# Patient Record
Sex: Female | Born: 1990 | Race: White | Hispanic: No | Marital: Single | Smoking: Former smoker
Health system: Southern US, Community
[De-identification: ages and names within clinical notes are randomized; demographics above are authoritative.]

## PROBLEM LIST (undated history)

## (undated) DIAGNOSIS — Z8781 Personal history of (healed) traumatic fracture: Secondary | ICD-10-CM

## (undated) DIAGNOSIS — R600 Localized edema: Secondary | ICD-10-CM

## (undated) DIAGNOSIS — Z8719 Personal history of other diseases of the digestive system: Secondary | ICD-10-CM

## (undated) DIAGNOSIS — G43909 Migraine, unspecified, not intractable, without status migrainosus: Secondary | ICD-10-CM

## (undated) DIAGNOSIS — M1990 Unspecified osteoarthritis, unspecified site: Secondary | ICD-10-CM

## (undated) DIAGNOSIS — M419 Scoliosis, unspecified: Secondary | ICD-10-CM

## (undated) DIAGNOSIS — F319 Bipolar disorder, unspecified: Secondary | ICD-10-CM

## (undated) HISTORY — PX: TUBAL LIGATION: SHX77

## (undated) HISTORY — DX: Localized edema: R60.0

## (undated) HISTORY — PX: TONSILLECTOMY: SUR1361

---

## 2011-08-23 ENCOUNTER — Emergency Department (HOSPITAL_COMMUNITY)
Admission: EM | Admit: 2011-08-23 | Discharge: 2011-08-24 | Discharge status: Against Medical Advice | Payer: Medicaid Other | Attending: Emergency Medicine | Admitting: Emergency Medicine

## 2011-08-23 ENCOUNTER — Encounter: Payer: Self-pay | Admitting: Emergency Medicine

## 2011-08-23 DIAGNOSIS — M25462 Effusion, left knee: Secondary | ICD-10-CM

## 2011-08-23 DIAGNOSIS — M79609 Pain in unspecified limb: Secondary | ICD-10-CM | POA: Insufficient documentation

## 2011-08-23 DIAGNOSIS — M25469 Effusion, unspecified knee: Secondary | ICD-10-CM | POA: Insufficient documentation

## 2011-08-23 NOTE — ED Notes (Signed)
Pt sts that she thinks she has a blood clot. Pt was in shower when she went to sit on her knees she had pain behind her R knee. Pt cont. To have pain with ambulation. Pt has never had pain like this in the past. CMS intact.

## 2011-08-24 MED ORDER — ENOXAPARIN SODIUM 60 MG/0.6ML ~~LOC~~ SOLN
1.0000 mg/kg | Freq: Once | SUBCUTANEOUS | Status: DC
  Filled 2011-08-24: qty 0.6

## 2011-08-24 NOTE — ED Provider Notes (Signed)
Medical screening examination/treatment/procedure(s) were performed by non-physician practitioner and as supervising physician I was immediately available for consultation/collaboration.   Vida Roller, MD 08/24/11 [phone]

## 2011-08-24 NOTE — ED Notes (Signed)
Venous Doppler ordered.  Went into room to give Lovenox injection.  Pt not in room.  Earley Favor NP made aware

## 2011-08-24 NOTE — ED Provider Notes (Signed)
History     CSN: [id number] Arrival date & time: 08/23/2011  9:58 PM   First MD Initiated Contact with Patient 08/24/11 0006      Chief Complaint  Patient presents with  . Leg Pain    (Consider location/radiation/quality/duration/timing/severity/associated sxs/prior treatment) HPI Comments: Patien tnoted a pain and swelling of lateral L knee today after kneeing now with less swelling but small bruise in the same area   Patient is a 20 y.o. female presenting with leg pain. The history is provided by the patient.  Leg Pain  The incident occurred 6 to 12 hours ago. The incident occurred at home. There was no injury mechanism. The pain is present in the left knee. The quality of the pain is described as aching. The pain is at a severity of 3/10. The pain is mild. The pain has been constant since onset. Pertinent negatives include no numbness, no inability to bear weight, no loss of motion and no muscle weakness. She reports no foreign bodies present. The symptoms are aggravated by bearing weight. She has tried nothing for the symptoms.    History reviewed. No pertinent past medical history.  History reviewed. No pertinent past surgical history.  History reviewed. No pertinent family history.  History  Substance Use Topics  . Smoking status: Smoker, Current Status Unknown    Types: Cigarettes  . Smokeless tobacco: Not on file  . Alcohol Use: No    OB History    Grav Para Term Preterm Abortions TAB SAB Ect Mult Living                  Review of Systems  Constitutional: Negative.   HENT: Negative.   Eyes: Negative.   Respiratory: Negative.   Cardiovascular: Negative.   Gastrointestinal: Negative.   Genitourinary: Negative.   Musculoskeletal: Negative for joint swelling.  Neurological: Negative.  Negative for numbness.  Hematological: Negative.   Psychiatric/Behavioral: Negative.     Allergies  Review of patient's allergies indicates no known allergies.  Home  Medications   Current Outpatient Rx  Name Route Sig Dispense Refill  . OMEPRAZOLE 20 MG PO CPDR Oral Take 20 mg by mouth 2 (two) times daily.      . TOPIRAMATE 100 MG PO TABS Oral Take 100 mg by mouth daily.        BP 123/70  Pulse 83  Temp(Src) 98 F (36.7 C) (Oral)  Resp 20  Ht 5\' 1"  (1.549 m)  Wt 118 lb (53.524 kg)  BMI 22.30 kg/m2  SpO2 100%  Physical Exam  Constitutional: She is oriented to person, place, and time. She appears well-developed and well-nourished.  Neck: Neck supple.  Cardiovascular: Regular rhythm.   Abdominal: Bowel sounds are normal.  Musculoskeletal: She exhibits tenderness. She exhibits no edema.       Right shoulder: She exhibits tenderness.  Neurological: She is oriented to person, place, and time.  Skin: Skin is warm and dry.  Psychiatric: She has a normal mood and affect.    ED Course  Procedures (including critical care time)  Labs Reviewed - No data to display No results found.   1. Swelling of knee joint, left       MDM  DVT ve bakers cyst will bring patien tback in AM for vascular study         Arman Filter, NP 08/24/11 4098  Arman Filter, NP 08/24/11 0037  Arman Filter, NP 08/24/11 [phone]

## 2011-10-05 DIAGNOSIS — F988 Other specified behavioral and emotional disorders with onset usually occurring in childhood and adolescence: Secondary | ICD-10-CM

## 2011-10-05 HISTORY — DX: Other specified behavioral and emotional disorders with onset usually occurring in childhood and adolescence: F98.8

## 2015-08-08 ENCOUNTER — Encounter (HOSPITAL_COMMUNITY): Payer: Self-pay | Admitting: *Deleted

## 2015-08-08 ENCOUNTER — Emergency Department (HOSPITAL_COMMUNITY): Payer: Medicaid Other

## 2015-08-08 ENCOUNTER — Emergency Department (HOSPITAL_COMMUNITY)
Admission: EM | Admit: 2015-08-08 | Discharge: 2015-08-09 | Disposition: A | Discharge status: Home / Self Care | Payer: Medicaid Other | Attending: Physician Assistant | Admitting: Physician Assistant

## 2015-08-08 DIAGNOSIS — S0033XA Contusion of nose, initial encounter: Secondary | ICD-10-CM | POA: Insufficient documentation

## 2015-08-08 DIAGNOSIS — Y9289 Other specified places as the place of occurrence of the external cause: Secondary | ICD-10-CM | POA: Diagnosis not present

## 2015-08-08 DIAGNOSIS — Y998 Other external cause status: Secondary | ICD-10-CM | POA: Insufficient documentation

## 2015-08-08 DIAGNOSIS — Y9389 Activity, other specified: Secondary | ICD-10-CM | POA: Insufficient documentation

## 2015-08-08 DIAGNOSIS — S32030A Wedge compression fracture of third lumbar vertebra, initial encounter for closed fracture: Secondary | ICD-10-CM

## 2015-08-08 DIAGNOSIS — Z720 Tobacco use: Secondary | ICD-10-CM | POA: Diagnosis not present

## 2015-08-08 DIAGNOSIS — S3992XA Unspecified injury of lower back, initial encounter: Secondary | ICD-10-CM | POA: Diagnosis present

## 2015-08-08 DIAGNOSIS — S32039A Unspecified fracture of third lumbar vertebra, initial encounter for closed fracture: Secondary | ICD-10-CM

## 2015-08-08 HISTORY — DX: Unspecified osteoarthritis, unspecified site: M19.90

## 2015-08-08 LAB — CBC WITH DIFFERENTIAL/PLATELET
BASOS PCT: 0 %
Basophils Absolute: 0 10*3/uL (ref 0.0–0.1)
EOS ABS: 0 10*3/uL (ref 0.0–0.7)
Eosinophils Relative: 0 %
HEMATOCRIT: 41.8 % (ref 36.0–46.0)
HEMOGLOBIN: 14 g/dL (ref 12.0–15.0)
LYMPHS ABS: 1.1 10*3/uL (ref 0.7–4.0)
LYMPHS PCT: 11 %
MCH: 32.7 pg (ref 26.0–34.0)
MCHC: 33.5 g/dL (ref 30.0–36.0)
MCV: 97.7 fL (ref 78.0–100.0)
MONOS PCT: 5 %
Monocytes Absolute: 0.4 10*3/uL (ref 0.1–1.0)
Neutro Abs: 8.2 10*3/uL — ABNORMAL HIGH (ref 1.7–7.7)
Neutrophils Relative %: 84 %
Platelets: 190 10*3/uL (ref 150–400)
RBC: 4.28 MIL/uL (ref 3.87–5.11)
RDW: 12.9 % (ref 11.5–15.5)
WBC: 9.7 10*3/uL (ref 4.0–10.5)

## 2015-08-08 LAB — PREGNANCY, URINE: PREG TEST UR: NEGATIVE

## 2015-08-08 LAB — COMPREHENSIVE METABOLIC PANEL
ALBUMIN: 3.9 g/dL (ref 3.5–5.0)
ALK PHOS: 95 U/L (ref 38–126)
ALT: 19 U/L (ref 14–54)
AST: 28 U/L (ref 15–41)
Anion gap: 13 (ref 5–15)
BUN: 6 mg/dL (ref 6–20)
CALCIUM: 9.4 mg/dL (ref 8.9–10.3)
CO2: 23 mmol/L (ref 22–32)
CREATININE: 0.81 mg/dL (ref 0.44–1.00)
Chloride: 108 mmol/L (ref 101–111)
GFR calc Af Amer: >60 mL/min (ref >60)
GFR calc non Af Amer: >60 mL/min (ref >60)
GLUCOSE: 110 mg/dL — AB (ref 65–99)
Potassium: 3.2 mmol/L — ABNORMAL LOW (ref 3.5–5.1)
SODIUM: 144 mmol/L (ref 135–145)
Total Bilirubin: 0.6 mg/dL (ref 0.3–1.2)
Total Protein: 6.3 g/dL — ABNORMAL LOW (ref 6.5–8.1)

## 2015-08-08 LAB — URINALYSIS W MICROSCOPIC (NOT AT ARMC)
Bilirubin Urine: NEGATIVE
Glucose, UA: NEGATIVE mg/dL
Hgb urine dipstick: NEGATIVE
KETONES UR: NEGATIVE mg/dL
Leukocytes, UA: NEGATIVE
NITRITE: NEGATIVE
PH: 7 (ref 5.0–8.0)
Protein, ur: NEGATIVE mg/dL
SPECIFIC GRAVITY, URINE: 1.012 (ref 1.005–1.030)
Urobilinogen, UA: 0.2 mg/dL (ref 0.0–1.0)

## 2015-08-08 MED ORDER — ONDANSETRON HCL 4 MG/2ML IJ SOLN: 4.0000 mg | Freq: Once | INTRAMUSCULAR | Status: DC

## 2015-08-08 MED ORDER — MORPHINE SULFATE (PF) 4 MG/ML IV SOLN
4.0000 mg | Freq: Once | INTRAVENOUS | Status: AC
  Administered 2015-08-08: 4 mg via INTRAVENOUS
  Filled 2015-08-08: qty 1

## 2015-08-08 MED ORDER — ONDANSETRON HCL 4 MG/2ML IJ SOLN
4.0000 mg | Freq: Once | INTRAMUSCULAR | Status: AC
  Administered 2015-08-08: 4 mg via INTRAVENOUS
  Filled 2015-08-08: qty 2

## 2015-08-08 MED ORDER — HYDROCODONE-ACETAMINOPHEN 5-325 MG PO TABS
2.0000 | ORAL_TABLET | Freq: Once | ORAL | Status: AC
  Administered 2015-08-08: 2 via ORAL
  Filled 2015-08-08: qty 2

## 2015-08-08 MED ORDER — MORPHINE SULFATE (PF) 4 MG/ML IV SOLN
6.0000 mg | Freq: Once | INTRAVENOUS | Status: AC
  Administered 2015-08-08: 6 mg via INTRAVENOUS
  Filled 2015-08-08: qty 2

## 2015-08-08 MED ORDER — MORPHINE SULFATE (PF) 4 MG/ML IV SOLN: 6.0000 mg | Freq: Once | INTRAVENOUS | Status: DC

## 2015-08-08 NOTE — Progress Notes (Addendum)
Orthopedic Tech Progress Note Patient Details:  Monica Wiggins [birth year] [id number] Brace has been ordered at 10:00pm Patient ID: Monica LothNakita Inks, female   DOB: [birth year], 24 y.o.   MRN: [id number]   Jennye MoccasinHughes, Kemisha Bonnette Craig 08/08/2015, 9:58 PM

## 2015-08-08 NOTE — ED Notes (Signed)
The pt returned from xray 
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
## 2015-08-08 NOTE — Progress Notes (Addendum)
Orthopedic Tech Progress Note Patient Details:  Monica Wiggins April 16, 1991 [id number] Paged bio-tech at 9:16 pm no call back Patient ID: Monica Wiggins, female   DOB: April 16, 1991, 24 y.o.   MRN: [id number]   Monica Wiggins, Monica Wiggins 08/08/2015, 9:46 PM

## 2015-08-08 NOTE — ED Notes (Signed)
Pt unable to void at present

## 2015-08-08 NOTE — ED Notes (Signed)
Pain  Only sl better.  Waiting to go back to xray

## 2015-08-08 NOTE — ED Notes (Signed)
Patient transported to X-ray 
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
## 2015-08-08 NOTE — ED Notes (Signed)
The pt just returned from  Xray.  She cannot void  She is requesting a cath urine.  Pain med given

## 2015-08-08 NOTE — ED Notes (Signed)
Pt currently in xray

## 2015-08-09 ENCOUNTER — Emergency Department (HOSPITAL_COMMUNITY): Payer: Medicaid Other

## 2015-08-09 MED ORDER — HYDROCODONE-ACETAMINOPHEN 5-325 MG PO TABS: 2.0000 | ORAL_TABLET | Freq: Four times a day (QID) | ORAL | Status: DC | PRN

## 2015-08-09 MED ORDER — HYDROCODONE-ACETAMINOPHEN 5-325 MG PO TABS
2.0000 | ORAL_TABLET | Freq: Once | ORAL | Status: AC
Start: 2015-08-09 — End: 2015-08-09
  Administered 2015-08-09: 2 via ORAL
  Filled 2015-08-09: qty 2

## 2015-08-09 NOTE — ED Notes (Signed)
Pt ambulating with assistance on d/c in no acute distress, A&Ox4. D/c instructions reviewed w/ pt and family - pt and family deny any further questions or concerns at present. Rx given x1

## 2015-08-09 NOTE — Discharge Instructions (Signed)
Lumbar Fracture A lumbar fracture is a break in one of the bones of the lower back. Lumbar fractures range in severity. Severe fractures can damage the spinal cord. CAUSES This condition may be caused by:  A fall (common).  A car accident (common).  A gunshot wound.  A hard, direct hit to the back.  Osteoporosis. SYMPTOMS The main symptom of this condition is severe pain in the lower back. If a fracture is complex or severe, there may also be:  A misshapen or swollen area on the lower back.  A limited ability to move an area of the lower back.  An inability to empty the bladder or bowel.  A loss of strength or sensation in the legs, feet, and toes.  Paralysis. DIAGNOSIS This condition is diagnosed based on:  A physical exam.  Symptoms and what happened just before they developed.  The results of imaging tests, such as an X-ray, CT scan, or MRI. If your nerves have been damaged, you may also have other tests to find out how much damage there is. TREATMENT Treatment for this condition depends on the specifics of the injury. Most fractures can be treated with:  A back brace.  Bed rest and activity restrictions.  Pain medicine.  Physical therapy. Fractures that are complex, involve multiple bones, or make the spine unstable may require surgery to remove pressure from the nerves or spinal cord and to stabilize the broken pieces of bone. During recovery, it is normal to have pain and stiffness in the back for weeks. HOME CARE INSTRUCTIONS Medicines  Take medicines only as directed by your health care provider.  Do not drive or operate heavy machinery while taking pain medicine. Activity  Stay in bed for as long as directed by your health care provider.  If you were shown how to do any exercises to improve motion and strength in your back, do them as directed by your health care provider.  Return to your normal activities as directed by your health care provider.  Ask your health care provider what activities are safe for you. General Instructions  If you were given a neck brace or back brace, wear it as directed by your health care provider.  Keep all follow-up visits as directed by your health care provider. This is important. Failure to follow-up as recommended could result in permanent injury, disability, and long-lasting (chronic) pain. SEEK MEDICAL CARE IF:  Your pain does not improve over time.  You have a persistent cough.  You cannot return to your normal activities as planned or expected. SEEK IMMEDIATE MEDICAL CARE IF:  You have severe pain or your pain suddenly gets worse.  You are unable to move.  You have numbness, tingling, weakness, or paralysis in any part of your body.  You cannot control your bladder or bowel.  You have difficulty breathing.  You have a fever.  You have pain in your chest or abdomen.  You vomit.   This information is not intended to replace advice given to you by your health care provider. Make sure you discuss any questions you have with your health care provider.   Document Released: 01/05/2007 Document Revised: 02/04/2015 Document Reviewed: 09/16/2014 Elsevier Interactive Patient Education 2016 [employer].  Thoracolumbosacral Orthosis Brace A thoracolumbosacral orthosis (TLSO) brace can be worn for different purposes. A TLSO brace can be worn to support the back. Your back may need more support if you had a broken bone in your back (fractured vertebrae), back surgery,  or you cannot move (paralysis) your torso muscles. A TLSO brace can also be worn by a child to help prevent curving back problems from getting worse as the child grows. TLSO braces are used to limit bending and twisting. The braces are made from a hard plastic. They are custom fit for each wearer. The TLSO brace covers both the front and back of the torso. On the back, the brace reaches from just above the tailbone to just below the  shoulder blades. On the front, the brace covers the ribs and often reaches past the waist and over the hips. The brace can be worn under clothing.  HOME CARE INSTRUCTIONS  Ask your caregiver if you can remove your brace when showering or sleeping.  Follow your caregiver's instructions for putting on your brace.  Follow your caregiver's instructions about how much weight you can lift.  Always wear a T-shirt under the brace.  Make sure the brace is always well-aligned and fits you closely.  Check your skin daily for sores and redness caused by rubbing or pressure.  If you feel unsteady, use a cane or walker for support until you feel more steady.  Sit in high, firm chairs. It may be difficult to stand up from low, soft chairs.  Keep all follow-up appointments as directed by your caregiver. SEEK IMMEDIATE MEDICAL CARE IF:  You have a fever.  You have shortness of breath.  You have chest pain.  You have numbness, tingling, or pain. Developed in conjunction with the [employer] at [facility] of [facility].   This information is not intended to replace advice given to you by your health care provider. Make sure you discuss any questions you have with your health care provider.   Document Released: 09/09/2011 Document Revised: 12/13/2011 Document Reviewed: 09/09/2011 Elsevier Interactive Patient Education 2016 [employer].  Spinal Compression Fracture A spinal compression fracture is a collapse of the bones that form the spine (vertebrae). With this type of fracture, the vertebrae become squashed (compressed) into a wedge shape. Most compression fractures happen in the middle or lower part of the spine. CAUSES This condition may be caused by:  Thinning and loss of density in the bones (osteoporosis). This is the most common cause.  A fall.  A car or motorcycle accident.  Cancer.  Trauma, such as a heavy, direct hit to the head. RISK  FACTORS You may be at greater risk for a spinal compression fracture if you:  Are 21 years old or older.  Have osteoporosis.  Have certain types of cancer, including:  Multiple myeloma.  Lymphoma.  Prostate cancer.  Lung cancer.  Breast cancer. SYMPTOMS Symptoms of this condition include:  Severe pain.  Pain that gets worse over time.  Pain that is worse when you stand, walk, sit, or bend.  Sudden pain that is so bad that it is hard for you to move.  Bending or humping of the spine.  Gradual loss of height.  Numbness, tingling, or weakness in the back and legs.  Trouble walking. Your symptoms will depend on the cause of the fracture and how quickly it develops. For example, fractures that are caused by osteoporosis can cause few symptoms, no symptoms, or symptoms that develop slowly over time. DIAGNOSIS This condition may be diagnosed based on symptoms, medical history, and a physical exam. During the physical exam, your health care provider may tap along the length of your spine to check for tenderness. Tests may be done  to confirm the diagnosis. They may include:  A bone density test to check for osteoporosis.  Imaging tests, such as a spine X-ray, a CT scan, or MRI. TREATMENT Treatment for this condition depends on the cause and severity of the condition.Some fractures, such as those that are caused by osteoporosis, may heal on their own with supportive care. This may include:  Pain medicine.  Rest.  A back brace.  Physical therapy exercises.  Medicine that reduces bone pain.  Calcium and vitamin D supplements. Fractures that cause the back to become misshapen, cause nerve pain or weakness, or do not respond to other treatment may be treated with a surgical procedure, such as:  Vertebroplasty. In this procedure, bone cement is injected into the collapsed vertebrae to stabilize them.  Balloon kyphoplasty. In this procedure, the collapsed vertebrae are  expanded with a balloon and then bone cement is injected into them.  Spinal fusion. In this procedure, the collapsed vertebrae are connected (fused) to normal vertebrae. HOME CARE INSTRUCTIONS General Instructions  Take medicines only as directed by your health care provider.  Do not drive or operate heavy machinery while taking pain medicine.  If directed, apply ice to the injured area:  Put ice in a plastic bag.  Place a towel between your skin and the bag.  Leave the ice on for 30 minutes every two hours at first. Then apply the ice as needed.  Wear your neck brace or back brace as directed by your health care provider.  Do not drink alcohol. Alcohol can interfere with your treatment.  Keep all follow-up visits as directed by your health care provider. This is important. It can help to prevent permanent injury, disability, and long-lasting (chronic) pain. Activity  Stay in bed (on bed rest) only as directed by your health care provider. Being on bed rest for too long can make your condition worse.  Return to your normal activities as directed by your health care provider. Ask what activities are safe for you.  Do exercises to improve motion and strength in your back (physical therapy), as recommended by your health care provider.  Exercise regularly as directed by your health care provider. SEEK MEDICAL CARE IF:  You have a fever.  You develop a cough that makes your pain worse.  Your pain medicine is not helping.  Your pain does not get better over time.  You cannot return to your normal activities as planned or expected. SEEK IMMEDIATE MEDICAL CARE IF:  Your pain is very bad and it suddenly gets worse.  You are unable to move any body part (paralysis) that is below the level of your injury.  You have numbness, tingling, or weakness in any body part that is below the level of your injury.  You cannot control your bladder or bowels.   This information is not  intended to replace advice given to you by your health care provider. Make sure you discuss any questions you have with your health care provider.   Document Released: 09/20/2005 Document Revised: 02/04/2015 Document Reviewed: 09/24/2014 Elsevier Interactive Patient Education [employer].

## 2015-08-15 DIAGNOSIS — R112 Nausea with vomiting, unspecified: Secondary | ICD-10-CM | POA: Insufficient documentation

## 2015-08-15 DIAGNOSIS — Z720 Tobacco use: Secondary | ICD-10-CM | POA: Diagnosis not present

## 2015-08-15 DIAGNOSIS — Z79899 Other long term (current) drug therapy: Secondary | ICD-10-CM | POA: Insufficient documentation

## 2015-08-15 DIAGNOSIS — M545 Low back pain: Secondary | ICD-10-CM | POA: Diagnosis present

## 2015-08-15 DIAGNOSIS — Z792 Long term (current) use of antibiotics: Secondary | ICD-10-CM | POA: Insufficient documentation

## 2015-08-15 DIAGNOSIS — R1084 Generalized abdominal pain: Secondary | ICD-10-CM | POA: Insufficient documentation

## 2015-08-15 DIAGNOSIS — S32030D Wedge compression fracture of third lumbar vertebra, subsequent encounter for fracture with routine healing: Secondary | ICD-10-CM | POA: Insufficient documentation

## 2015-08-15 DIAGNOSIS — R197 Diarrhea, unspecified: Secondary | ICD-10-CM | POA: Insufficient documentation

## 2015-08-16 ENCOUNTER — Emergency Department (HOSPITAL_COMMUNITY)
Admission: EM | Admit: 2015-08-16 | Discharge: 2015-08-16 | Disposition: A | Discharge status: Home / Self Care | Payer: Medicaid Other | Attending: Emergency Medicine | Admitting: Emergency Medicine

## 2015-08-16 ENCOUNTER — Emergency Department (HOSPITAL_COMMUNITY): Payer: Medicaid Other

## 2015-08-16 ENCOUNTER — Encounter (HOSPITAL_COMMUNITY): Payer: Self-pay | Admitting: *Deleted

## 2015-08-16 DIAGNOSIS — S32000A Wedge compression fracture of unspecified lumbar vertebra, initial encounter for closed fracture: Secondary | ICD-10-CM

## 2015-08-16 DIAGNOSIS — R112 Nausea with vomiting, unspecified: Secondary | ICD-10-CM

## 2015-08-16 LAB — URINALYSIS, ROUTINE W REFLEX MICROSCOPIC
Bilirubin Urine: NEGATIVE
GLUCOSE, UA: NEGATIVE mg/dL
Hgb urine dipstick: NEGATIVE
Ketones, ur: NEGATIVE mg/dL
LEUKOCYTES UA: NEGATIVE
NITRITE: NEGATIVE
PROTEIN: NEGATIVE mg/dL
Specific Gravity, Urine: 1.026 (ref 1.005–1.030)
UROBILINOGEN UA: 0.2 mg/dL (ref 0.0–1.0)
pH: 5 (ref 5.0–8.0)

## 2015-08-16 LAB — COMPREHENSIVE METABOLIC PANEL
ALBUMIN: 4.1 g/dL (ref 3.5–5.0)
ALK PHOS: 108 U/L (ref 38–126)
ALT: 26 U/L (ref 14–54)
ANION GAP: 12 (ref 5–15)
AST: 30 U/L (ref 15–41)
BUN: 22 mg/dL — ABNORMAL HIGH (ref 6–20)
CALCIUM: 9.4 mg/dL (ref 8.9–10.3)
CO2: 18 mmol/L — AB (ref 22–32)
Chloride: 110 mmol/L (ref 101–111)
Creatinine, Ser: 0.81 mg/dL (ref 0.44–1.00)
GFR calc Af Amer: >60 mL/min (ref >60)
GFR calc non Af Amer: >60 mL/min (ref >60)
GLUCOSE: 153 mg/dL — AB (ref 65–99)
Potassium: 3.7 mmol/L (ref 3.5–5.1)
SODIUM: 140 mmol/L (ref 135–145)
Total Bilirubin: 0.9 mg/dL (ref 0.3–1.2)
Total Protein: 7.5 g/dL (ref 6.5–8.1)

## 2015-08-16 LAB — CBC WITH DIFFERENTIAL/PLATELET
BASOS ABS: 0 10*3/uL (ref 0.0–0.1)
Basophils Relative: 0 %
EOS ABS: 0 10*3/uL (ref 0.0–0.7)
Eosinophils Relative: 0 %
HCT: 45.6 % (ref 36.0–46.0)
HEMOGLOBIN: 15.4 g/dL — AB (ref 12.0–15.0)
LYMPHS PCT: 7 %
Lymphs Abs: 0.6 10*3/uL — ABNORMAL LOW (ref 0.7–4.0)
MCH: 32.6 pg (ref 26.0–34.0)
MCHC: 33.8 g/dL (ref 30.0–36.0)
MCV: 96.6 fL (ref 78.0–100.0)
Monocytes Absolute: 0.1 10*3/uL (ref 0.1–1.0)
Monocytes Relative: 1 %
NEUTROS ABS: 8.2 10*3/uL — AB (ref 1.7–7.7)
NEUTROS PCT: 92 %
Platelets: 280 10*3/uL (ref 150–400)
RBC: 4.72 MIL/uL (ref 3.87–5.11)
RDW: 12.4 % (ref 11.5–15.5)
WBC: 8.9 10*3/uL (ref 4.0–10.5)

## 2015-08-16 LAB — I-STAT BETA HCG BLOOD, ED (MC, WL, AP ONLY): I-stat hCG, quantitative: <5 m[IU]/mL (ref <5)

## 2015-08-16 MED ORDER — METOCLOPRAMIDE HCL 5 MG/ML IJ SOLN
10.0000 mg | INTRAMUSCULAR | Status: AC
  Administered 2015-08-16: 10 mg via INTRAVENOUS

## 2015-08-16 MED ORDER — ONDANSETRON HCL 4 MG PO TABS: 4.0000 mg | ORAL_TABLET | Freq: Four times a day (QID) | ORAL | Status: DC | PRN

## 2015-08-16 MED ORDER — ONDANSETRON HCL 4 MG/2ML IJ SOLN
4.0000 mg | Freq: Once | INTRAMUSCULAR | Status: AC
  Administered 2015-08-16: 4 mg via INTRAVENOUS
  Filled 2015-08-16: qty 2

## 2015-08-16 MED ORDER — SODIUM CHLORIDE 0.9 % IV BOLUS (SEPSIS)
1000.0000 mL | Freq: Once | INTRAVENOUS | Status: AC
  Administered 2015-08-16: 1000 mL via INTRAVENOUS

## 2015-08-16 MED ORDER — OXYCODONE-ACETAMINOPHEN 5-325 MG PO TABS
2.0000 | ORAL_TABLET | Freq: Once | ORAL | Status: AC
  Administered 2015-08-16: 2 via ORAL
  Filled 2015-08-16: qty 2

## 2015-08-16 MED ORDER — HYDROCODONE-ACETAMINOPHEN 5-325 MG PO TABS: 1.0000 | ORAL_TABLET | Freq: Four times a day (QID) | ORAL | Status: DC | PRN

## 2015-08-16 MED ORDER — MORPHINE SULFATE (PF) 4 MG/ML IV SOLN
4.0000 mg | Freq: Once | INTRAVENOUS | Status: AC
  Administered 2015-08-16: 4 mg via INTRAVENOUS
  Filled 2015-08-16: qty 1

## 2015-08-16 MED ORDER — KETOROLAC TROMETHAMINE 30 MG/ML IJ SOLN
30.0000 mg | Freq: Once | INTRAMUSCULAR | Status: AC
  Administered 2015-08-16: 30 mg via INTRAVENOUS
  Filled 2015-08-16: qty 1

## 2015-08-16 NOTE — Discharge Instructions (Signed)
Take Norco as needed for pain control; try to wean off of this medication slowly as I suspect your symptoms were due to abruptly stopping your pain medication. Follow up with your primary care doctor in 1 week and your neurosurgeon as instructed. Continue to wear your brace as previously instructed. Return to the ED as needed if symptoms worsen especially if you lose sensation in your legs, stool or urinate on yourself, or develop a fever or any of the symptoms listed below.  Opioid Withdrawal Opioids are a group of narcotic drugs. They include the street drug heroin. They also include pain medicines, such as morphine, hydrocodone, oxycodone, and fentanyl. Opioid withdrawal is a group of characteristic physical and mental signs and symptoms. It typically occurs if you have been using opioids daily for several weeks or longer and stop using or rapidly decrease use. Opioid withdrawal can also occur if you have used opioids daily for a long time and are given a medicine to block the effect.  SIGNS AND SYMPTOMS Opioid withdrawal includes three or more of the following symptoms:   Depressed, anxious, or irritable mood.  Nausea or vomiting.  Muscle aches or spasms.   Watery eyes.   Runny nose.  Dilated pupils, sweating, or hairs standing on end.  Diarrhea or intestinal cramping.  Yawning.   Fever.  Increased blood pressure.  Fast pulse.  Restlessness or trouble sleeping. These signs and symptoms occur within several hours of stopping or reducing short-acting opioids, such as heroin. They can occur within 3 days of stopping or reducing long-acting opioids, such as methadone. Withdrawal begins within minutes of receiving a drug that blocks the effects of opioids, such as naltrexone or naloxone. DIAGNOSIS  Opioid use disorder is diagnosed by your health care provider. You will be asked about your symptoms, drug and alcohol use, medical history, and use of medicines. A physical exam may be  done. Lab tests may be ordered. Your health care provider may have you see a mental health professional.  TREATMENT  The treatment for opioid withdrawal is usually provided by medical doctors with special training in substance use disorders (addiction specialists). The following medicines may be included in treatment:  Opioids given in place of the abused opioid. They turn on opioid receptors in the brain and lessen or prevent withdrawal symptoms. They are gradually decreased (opioid substitution and taper).  Non-opioids that can lessen certain opioid withdrawal symptoms. They may be used alone or with opioid substitution and taper. Successful long-term recovery usually requires medicine, counseling, and group support. HOME CARE INSTRUCTIONS   Take medicines only as directed by your health care provider.  Check with your health care provider before starting new medicines.  Keep all follow-up visits as directed by your health care provider. SEEK MEDICAL CARE IF:  You are not able to take your medicines as directed.  Your symptoms get worse.  You relapse. SEEK IMMEDIATE MEDICAL CARE IF:  You have serious thoughts about hurting yourself or others.  You have a seizure.  You lose consciousness.   This information is not intended to replace advice given to you by your health care provider. Make sure you discuss any questions you have with your health care provider.   Document Released: 09/23/2003 Document Revised: 10/11/2014 Document Reviewed: 10/03/2013 Elsevier Interactive Patient Education 2016 Elsevier Inc. Vertebral Fracture A vertebral fracture is a break in one of the bones that make up the spine (vertebrae). The vertebrae are stacked on top of each other to form  the spinal column. They support the body and protect the spinal cord. The vertebral column has an upper part (cervical spine), a middle part (thoracic spine), and a lower part (lumbar spine). Most vertebral fractures  occur in the thoracic spine or lumbar spine. There are three main types of vertebral fractures:  Flexion fracture. This happens when vertebrae collapse. Vertebrae can collapse:  In the front (compression fracture). This type of fracture is common in people who have a condition that causes their bones to be weak and brittle (osteoporosis). The fracture can make a person lose height.  In the front and back (axial burst fracture).  Extension fracture. This happens when an external force pulls apart the vertebrae.  Rotation fracture. This happens when the spine bends extremely in one direction. This type can cause a piece of a vertebra to break off (transverse process fracture) or move out of its normal position (fracture dislocation). This type of fracture has a high risk for spinal cord injury. Vertebral fractures can range from mild to very severe. The most severe types are those that cause the broken bones to move out of place (unstable) and those that injure or press on the spinal cord. CAUSES This condition is usually caused by a forceful injury. This type of injury commonly results from:  Car accidents.  Falling or jumping from a great height.  Collisions in contact sports.  Violent acts, such as an assault or a gunshot wound. RISK FACTORS This injury is more likely to happen to people who:  Have osteoporosis.  Participate in contact sports.  Are in situations that could result in falls or other violent injuries. SYMPTOMS Symptoms of this injury depend on the location and the type of fracture. The most common symptom is back pain that gets worse with movement. You may also have trouble standing or walking. If a fracture has damaged your spinal cord or is pressing on it, you may also have:  Numbness.  Tingling.  Weakness.  Loss of movement.  Loss of bowel or bladder control. DIAGNOSIS This injury may be diagnosed based on symptoms, medical history, and a physical exam.  You may also have imaging tests to confirm the diagnosis. These may include:  Spine X-ray.  CT scan.  MRI. TREATMENT Treatment for this injury depends on the type of fracture. If your fracture is stable and does not affect your spinal cord, it may heal with nonsurgical treatment, such as:  Taking pain medicine.  Wearing a cast or a brace.  Doing physical therapy exercises. If your vertebral fracture is unstable or it affects your spinal cord, you may need surgical treatment, such as:  Laminectomy. This procedure involves removing the part of a vertebra that is pushing on the spinal cord (spinal decompression surgery). Bone fragments may also be removed.  Spinal fusion. This procedure is used to stabilize an unstable fracture. Vertebrae may be joined together with a piece of bone from another part of your body (graft) and held in place with rods, plates, or screws.  Vertebroplasty. In this procedure, bone cement is used to rebuild collapsed vertebrae. HOME CARE INSTRUCTIONS General Instructions  Take medicines only as directed by your health care provider.  Do not drive or operate heavy machinery while taking pain medicine.  If directed, apply ice to the injured area:  Put ice in a plastic bag.  Place a towel between your skin and the bag.  Leave the ice on for 30 minutes every two hours at first. Then  apply the ice as needed.  Wear your neck brace or back brace as directed by your health care provider.  Do not drink alcohol. Alcohol can interfere with your treatment.  Keep all follow-up visits as directed by your health care provider. This is important. It can help to prevent permanent injury, disability, and long-lasting (chronic) pain. Activity  Stay in bed (on bed rest) only as directed by your health care provider. Being on bed rest for too long can make your condition worse.  Return to your normal activities as directed by your health care provider. Ask what  activities are safe for you.  Do exercises to improve motion and strength in your back (physical therapy), as recommended by your health care provider.   Exercise regularly as directed by your health care provider. SEEK MEDICAL CARE IF:  You have a fever.  You develop a cough that makes your pain worse.  Your pain medicine is not helping.  Your pain does not get better over time.  You cannot return to your normal activities as planned or expected. SEEK IMMEDIATE MEDICAL CARE IF:  Your pain is very bad and it suddenly gets worse.  You are unable to move any body part (paralysis) that is below the level of your injury.  You have numbness, tingling, or weakness in any body part that is below the level of your injury.  You cannot control your bladder or bowels.   This information is not intended to replace advice given to you by your health care provider. Make sure you discuss any questions you have with your health care provider.   Document Released: 10/28/2004 Document Revised: 02/04/2015 Document Reviewed: 09/25/2014 Elsevier Interactive Patient Education [employer]

## 2015-08-16 NOTE — ED Provider Notes (Signed)
Medical screening examination/treatment/procedure(s) were conducted as a shared visit with non-physician practitioner(s) and myself.  I personally evaluated the patient during the encounter.   EKG Interpretation None       Jequan Shahin, MD 08/16/15 0710
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
## 2015-08-17 ENCOUNTER — Emergency Department (HOSPITAL_COMMUNITY): Payer: Medicaid Other

## 2015-08-17 ENCOUNTER — Emergency Department (HOSPITAL_COMMUNITY)
Admission: EM | Admit: 2015-08-17 | Discharge: 2015-08-17 | Disposition: A | Discharge status: Home / Self Care | Payer: Medicaid Other | Attending: Emergency Medicine | Admitting: Emergency Medicine

## 2015-08-17 ENCOUNTER — Encounter (HOSPITAL_COMMUNITY): Payer: Self-pay | Admitting: *Deleted

## 2015-08-17 DIAGNOSIS — K219 Gastro-esophageal reflux disease without esophagitis: Secondary | ICD-10-CM | POA: Diagnosis not present

## 2015-08-17 DIAGNOSIS — X58XXXD Exposure to other specified factors, subsequent encounter: Secondary | ICD-10-CM | POA: Insufficient documentation

## 2015-08-17 DIAGNOSIS — S32030D Wedge compression fracture of third lumbar vertebra, subsequent encounter for fracture with routine healing: Secondary | ICD-10-CM

## 2015-08-17 DIAGNOSIS — M419 Scoliosis, unspecified: Secondary | ICD-10-CM | POA: Diagnosis not present

## 2015-08-17 DIAGNOSIS — F319 Bipolar disorder, unspecified: Secondary | ICD-10-CM | POA: Insufficient documentation

## 2015-08-17 DIAGNOSIS — F17210 Nicotine dependence, cigarettes, uncomplicated: Secondary | ICD-10-CM | POA: Insufficient documentation

## 2015-08-17 DIAGNOSIS — R159 Full incontinence of feces: Secondary | ICD-10-CM | POA: Insufficient documentation

## 2015-08-17 DIAGNOSIS — M1990 Unspecified osteoarthritis, unspecified site: Secondary | ICD-10-CM | POA: Diagnosis not present

## 2015-08-17 DIAGNOSIS — R197 Diarrhea, unspecified: Secondary | ICD-10-CM | POA: Insufficient documentation

## 2015-08-17 DIAGNOSIS — Z79899 Other long term (current) drug therapy: Secondary | ICD-10-CM | POA: Insufficient documentation

## 2015-08-17 DIAGNOSIS — Z792 Long term (current) use of antibiotics: Secondary | ICD-10-CM | POA: Diagnosis not present

## 2015-08-17 DIAGNOSIS — G43909 Migraine, unspecified, not intractable, without status migrainosus: Secondary | ICD-10-CM | POA: Insufficient documentation

## 2015-08-17 HISTORY — DX: Migraine, unspecified, not intractable, without status migrainosus: G43.909

## 2015-08-17 HISTORY — DX: Personal history of (healed) traumatic fracture: Z87.81

## 2015-08-17 HISTORY — DX: Personal history of other diseases of the digestive system: Z87.19

## 2015-08-17 HISTORY — DX: Bipolar disorder, unspecified: F31.9

## 2015-08-17 HISTORY — DX: Scoliosis, unspecified: M41.9

## 2015-08-17 LAB — CBC WITH DIFFERENTIAL/PLATELET
Basophils Absolute: 0 10*3/uL (ref 0.0–0.1)
Basophils Relative: 1 %
Eosinophils Absolute: 0 10*3/uL (ref 0.0–0.7)
Eosinophils Relative: 0 %
HEMATOCRIT: 39.8 % (ref 36.0–46.0)
HEMOGLOBIN: 14 g/dL (ref 12.0–15.0)
LYMPHS ABS: 1.3 10*3/uL (ref 0.7–4.0)
LYMPHS PCT: 22 %
MCH: 33.9 pg (ref 26.0–34.0)
MCHC: 35.2 g/dL (ref 30.0–36.0)
MCV: 96.4 fL (ref 78.0–100.0)
MONO ABS: 0.4 10*3/uL (ref 0.1–1.0)
MONOS PCT: 7 %
NEUTROS ABS: 4.1 10*3/uL (ref 1.7–7.7)
NEUTROS PCT: 70 %
Platelets: 216 10*3/uL (ref 150–400)
RBC: 4.13 MIL/uL (ref 3.87–5.11)
RDW: 12.5 % (ref 11.5–15.5)
WBC: 5.9 10*3/uL (ref 4.0–10.5)

## 2015-08-17 LAB — COMPREHENSIVE METABOLIC PANEL
ALBUMIN: 3.2 g/dL — AB (ref 3.5–5.0)
ALK PHOS: 86 U/L (ref 38–126)
ALT: 18 U/L (ref 14–54)
ANION GAP: 8 (ref 5–15)
AST: 23 U/L (ref 15–41)
BUN: 7 mg/dL (ref 6–20)
CALCIUM: 8.4 mg/dL — AB (ref 8.9–10.3)
CHLORIDE: 110 mmol/L (ref 101–111)
CO2: 20 mmol/L — AB (ref 22–32)
Creatinine, Ser: 0.6 mg/dL (ref 0.44–1.00)
GFR calc Af Amer: >60 mL/min (ref >60)
GFR calc non Af Amer: >60 mL/min (ref >60)
GLUCOSE: 97 mg/dL (ref 65–99)
Potassium: 3.2 mmol/L — ABNORMAL LOW (ref 3.5–5.1)
SODIUM: 138 mmol/L (ref 135–145)
Total Bilirubin: 0.4 mg/dL (ref 0.3–1.2)
Total Protein: 5.8 g/dL — ABNORMAL LOW (ref 6.5–8.1)

## 2015-08-17 LAB — PROTIME-INR
INR: 1.13 (ref 0.00–1.49)
Prothrombin Time: 14.6 seconds (ref 11.6–15.2)

## 2015-08-17 LAB — APTT: aPTT: 34 seconds (ref 24–37)

## 2015-08-17 MED ORDER — ONDANSETRON 4 MG PO TBDP
4.0000 mg | ORAL_TABLET | Freq: Once | ORAL | Status: AC
  Administered 2015-08-17: 4 mg via ORAL
  Filled 2015-08-17: qty 1

## 2015-08-17 MED ORDER — HYDROCODONE-ACETAMINOPHEN 5-325 MG PO TABS
1.0000 | ORAL_TABLET | Freq: Once | ORAL | Status: AC
  Administered 2015-08-17: 1 via ORAL
  Filled 2015-08-17: qty 1

## 2015-08-17 MED ORDER — MORPHINE SULFATE (PF) 4 MG/ML IV SOLN
4.0000 mg | Freq: Once | INTRAVENOUS | Status: AC
  Administered 2015-08-17: 4 mg via INTRAVENOUS
  Filled 2015-08-17: qty 1

## 2015-08-17 MED ORDER — POTASSIUM CHLORIDE CRYS ER 20 MEQ PO TBCR
20.0000 meq | EXTENDED_RELEASE_TABLET | Freq: Once | ORAL | Status: AC
  Administered 2015-08-17: 20 meq via ORAL
  Filled 2015-08-17: qty 1

## 2015-08-17 MED ORDER — SODIUM CHLORIDE 0.9 % IV BOLUS (SEPSIS)
500.0000 mL | Freq: Once | INTRAVENOUS | Status: AC
  Administered 2015-08-17: 500 mL via INTRAVENOUS

## 2015-08-17 NOTE — ED Notes (Signed)
Marijean NiemannJaime, PA at the bedside.

## 2015-08-17 NOTE — Discharge Instructions (Signed)
1. Medications: Continue taking home medications 2. Treatment: rest, drink plenty of fluids, wear back brace 3. Follow Up: Please follow up with your primary doctor within 3 days for discussion of your diagnoses and further evaluation after today's visit; Please call and make appointment with Dr. Bevely Palmeritty, the neurosurgeon! Please return to the ER for any new or worsening symptoms, any additional concerns.
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
## 2015-08-17 NOTE — ED Notes (Signed)
PA at bedside.
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
## 2015-09-19 ENCOUNTER — Encounter (HOSPITAL_COMMUNITY): Payer: Self-pay

## 2015-09-19 ENCOUNTER — Emergency Department (HOSPITAL_COMMUNITY)
Admission: EM | Admit: 2015-09-19 | Discharge: 2015-09-19 | Disposition: A | Discharge status: Home / Self Care | Payer: Medicaid Other | Attending: Emergency Medicine | Admitting: Emergency Medicine

## 2015-09-19 DIAGNOSIS — Z3202 Encounter for pregnancy test, result negative: Secondary | ICD-10-CM | POA: Diagnosis not present

## 2015-09-19 DIAGNOSIS — F17210 Nicotine dependence, cigarettes, uncomplicated: Secondary | ICD-10-CM | POA: Diagnosis not present

## 2015-09-19 DIAGNOSIS — M1990 Unspecified osteoarthritis, unspecified site: Secondary | ICD-10-CM | POA: Diagnosis not present

## 2015-09-19 DIAGNOSIS — M419 Scoliosis, unspecified: Secondary | ICD-10-CM | POA: Diagnosis not present

## 2015-09-19 DIAGNOSIS — G43909 Migraine, unspecified, not intractable, without status migrainosus: Secondary | ICD-10-CM | POA: Insufficient documentation

## 2015-09-19 DIAGNOSIS — M545 Low back pain: Secondary | ICD-10-CM | POA: Insufficient documentation

## 2015-09-19 DIAGNOSIS — K219 Gastro-esophageal reflux disease without esophagitis: Secondary | ICD-10-CM | POA: Insufficient documentation

## 2015-09-19 DIAGNOSIS — R51 Headache: Secondary | ICD-10-CM

## 2015-09-19 DIAGNOSIS — R1032 Left lower quadrant pain: Secondary | ICD-10-CM | POA: Diagnosis not present

## 2015-09-19 DIAGNOSIS — F319 Bipolar disorder, unspecified: Secondary | ICD-10-CM | POA: Diagnosis not present

## 2015-09-19 DIAGNOSIS — Z79899 Other long term (current) drug therapy: Secondary | ICD-10-CM | POA: Insufficient documentation

## 2015-09-19 DIAGNOSIS — R519 Headache, unspecified: Secondary | ICD-10-CM

## 2015-09-19 DIAGNOSIS — Z792 Long term (current) use of antibiotics: Secondary | ICD-10-CM | POA: Insufficient documentation

## 2015-09-19 DIAGNOSIS — Z8781 Personal history of (healed) traumatic fracture: Secondary | ICD-10-CM | POA: Diagnosis not present

## 2015-09-19 LAB — POC URINE PREG, ED: Preg Test, Ur: NEGATIVE

## 2015-09-19 LAB — CBC
HEMATOCRIT: 39.3 % (ref 36.0–46.0)
HEMOGLOBIN: 12.8 g/dL (ref 12.0–15.0)
MCH: 31.9 pg (ref 26.0–34.0)
MCHC: 32.6 g/dL (ref 30.0–36.0)
MCV: 98 fL (ref 78.0–100.0)
Platelets: 241 10*3/uL (ref 150–400)
RBC: 4.01 MIL/uL (ref 3.87–5.11)
RDW: 12.5 % (ref 11.5–15.5)
WBC: 10.1 10*3/uL (ref 4.0–10.5)

## 2015-09-19 LAB — COMPREHENSIVE METABOLIC PANEL
ALT: 14 U/L (ref 14–54)
ANION GAP: 9 (ref 5–15)
AST: 16 U/L (ref 15–41)
Albumin: 3.8 g/dL (ref 3.5–5.0)
Alkaline Phosphatase: 86 U/L (ref 38–126)
BILIRUBIN TOTAL: 0.2 mg/dL — AB (ref 0.3–1.2)
BUN: 10 mg/dL (ref 6–20)
CHLORIDE: 114 mmol/L — AB (ref 101–111)
CO2: 21 mmol/L — ABNORMAL LOW (ref 22–32)
Calcium: 8.8 mg/dL — ABNORMAL LOW (ref 8.9–10.3)
Creatinine, Ser: 0.68 mg/dL (ref 0.44–1.00)
Glucose, Bld: 116 mg/dL — ABNORMAL HIGH (ref 65–99)
POTASSIUM: 3.5 mmol/L (ref 3.5–5.1)
Sodium: 144 mmol/L (ref 135–145)
TOTAL PROTEIN: 6.2 g/dL — AB (ref 6.5–8.1)

## 2015-09-19 LAB — URINE MICROSCOPIC-ADD ON

## 2015-09-19 LAB — LIPASE, BLOOD: LIPASE: 39 U/L (ref 11–51)

## 2015-09-19 LAB — URINALYSIS, ROUTINE W REFLEX MICROSCOPIC
Bilirubin Urine: NEGATIVE
Glucose, UA: NEGATIVE mg/dL
Hgb urine dipstick: NEGATIVE
KETONES UR: NEGATIVE mg/dL
LEUKOCYTES UA: NEGATIVE
NITRITE: NEGATIVE
PH: 7.5 (ref 5.0–8.0)
PROTEIN: NEGATIVE mg/dL
Specific Gravity, Urine: 1.011 (ref 1.005–1.030)

## 2015-09-19 MED ORDER — SODIUM CHLORIDE 0.9 % IV BOLUS (SEPSIS)
1000.0000 mL | Freq: Once | INTRAVENOUS | Status: AC
  Administered 2015-09-19: 1000 mL via INTRAVENOUS

## 2015-09-19 MED ORDER — DIPHENHYDRAMINE HCL 50 MG/ML IJ SOLN
25.0000 mg | Freq: Once | INTRAMUSCULAR | Status: AC
  Administered 2015-09-19: 25 mg via INTRAVENOUS
  Filled 2015-09-19: qty 1

## 2015-09-19 MED ORDER — PROCHLORPERAZINE EDISYLATE 5 MG/ML IJ SOLN
10.0000 mg | Freq: Once | INTRAMUSCULAR | Status: AC
  Administered 2015-09-19: 10 mg via INTRAVENOUS
  Filled 2015-09-19: qty 2

## 2015-09-19 MED ORDER — KETOROLAC TROMETHAMINE 30 MG/ML IJ SOLN
30.0000 mg | Freq: Once | INTRAMUSCULAR | Status: AC
  Administered 2015-09-19: 30 mg via INTRAVENOUS
  Filled 2015-09-19: qty 1

## 2015-09-19 MED ORDER — ONDANSETRON HCL 4 MG/2ML IJ SOLN
4.0000 mg | Freq: Once | INTRAMUSCULAR | Status: AC | PRN
  Administered 2015-09-19: 4 mg via INTRAVENOUS
  Filled 2015-09-19: qty 2

## 2015-09-19 NOTE — ED Notes (Signed)
Pt verbalized understanding of d/c instructions and follow-up care. No further questions/concerns, VSS, ambulatory w/ steady gait (refused wheelchair) 

## 2015-09-19 NOTE — ED Notes (Signed)
Per [facility] - pt has hx lumbar fracture but has not scheduled surgery yet. Pt c/o back pain and n/v beginning yesterday, migraine began this morning. Pt believes pain in back caused n/v. Pt hx migraines and GERD.
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
## 2015-09-19 NOTE — Discharge Instructions (Signed)
Read the information below.  You may return to the Emergency Department at any time for worsening condition or any new symptoms that concern you.   If you develop fevers, loss of control of bowel or bladder, weakness or numbness in your legs, or are unable to walk, return to the ER for a recheck.   You are having a headache. No specific cause was found today for your headache. It may have been a migraine or other cause of headache. Stress, anxiety, fatigue, and depression are common triggers for headaches. Your headache today does not appear to be life-threatening or require hospitalization, but often the exact cause of headaches is not determined in the emergency department. Therefore, follow-up with your doctor is very important to find out what may have caused your headache, and whether or not you need any further diagnostic testing or treatment. Sometimes headaches can appear benign (not harmful), but then more serious symptoms can develop which should prompt an immediate re-evaluation by your doctor or the emergency department. SEEK MEDICAL ATTENTION IF: You develop possible problems with medications prescribed.  The medications don't resolve your headache, if it recurs , or if you have multiple episodes of vomiting or can't take fluids. You have a change from the usual headache. RETURN IMMEDIATELY IF you develop a sudden, severe headache or confusion, become poorly responsive or faint, develop a fever above 100.48F or problem breathing, have a change in speech, vision, swallowing, or understanding, or develop new weakness, numbness, tingling, incoordination, or have a seizure.    General Headache Without Cause A headache is pain or discomfort felt around the head or neck area. There are many causes and types of headaches. In some cases, the cause may not be found.  HOME CARE  Managing Pain  Take over-the-counter and prescription medicines only as told by your doctor.  Lie down in a dark, quiet  room when you have a headache.  If directed, apply ice to the head and neck area:  Put ice in a plastic bag.  Place a towel between your skin and the bag.  Leave the ice on for 20 minutes, 2-3 times per day.  Use a heating pad or hot shower to apply heat to the head and neck area as told by your doctor.  Keep lights dim if bright lights bother you or make your headaches worse. Eating and Drinking  Eat meals on a regular schedule.  Lessen how much alcohol you drink.  Lessen how much caffeine you drink, or stop drinking caffeine. General Instructions  Keep all follow-up visits as told by your doctor. This is important.  Keep a journal to find out if certain things bring on headaches. For example, write down:  What you eat and drink.  How much sleep you get.  Any change to your diet or medicines.  Relax by getting a massage or doing other relaxing activities.  Lessen stress.  Sit up straight. Do not tighten (tense) your muscles.  Do not use tobacco products. This includes cigarettes, chewing tobacco, or e-cigarettes. If you need help quitting, ask your doctor.  Exercise regularly as told by your doctor.  Get enough sleep. This often means 7-9 hours of sleep. GET HELP IF:  Your symptoms are not helped by medicine.  You have a headache that feels different than the other headaches.  You feel sick to your stomach (nauseous) or you throw up (vomit).  You have a fever. GET HELP RIGHT AWAY IF:   Your headache  becomes really bad.  You keep throwing up.  You have a stiff neck.  You have trouble seeing.  You have trouble speaking.  You have pain in the eye or ear.  Your muscles are weak or you lose muscle control.  You lose your balance or have trouble walking.  You feel like you will pass out (faint) or you pass out.  You have confusion.   This information is not intended to replace advice given to you by your health care provider. Make sure you discuss  any questions you have with your health care provider.   Document Released: 06/29/2008 Document Revised: 06/11/2015 Document Reviewed: 01/13/2015 Elsevier Interactive Patient Education [employer]2016 Elsevier Inc.

## 2015-12-03 HISTORY — PX: VERTEBROPLASTY: SHX113

## 2016-01-27 ENCOUNTER — Emergency Department (HOSPITAL_COMMUNITY): Payer: Medicaid Other

## 2016-01-27 ENCOUNTER — Emergency Department (HOSPITAL_COMMUNITY)
Admission: EM | Admit: 2016-01-27 | Discharge: 2016-01-28 | Disposition: A | Discharge status: Home / Self Care | Payer: Medicaid Other | Attending: Emergency Medicine | Admitting: Emergency Medicine

## 2016-01-27 ENCOUNTER — Encounter (HOSPITAL_COMMUNITY): Payer: Self-pay | Admitting: Family Medicine

## 2016-01-27 DIAGNOSIS — F17210 Nicotine dependence, cigarettes, uncomplicated: Secondary | ICD-10-CM | POA: Diagnosis not present

## 2016-01-27 DIAGNOSIS — Z3202 Encounter for pregnancy test, result negative: Secondary | ICD-10-CM | POA: Diagnosis not present

## 2016-01-27 DIAGNOSIS — M545 Low back pain: Secondary | ICD-10-CM | POA: Diagnosis not present

## 2016-01-27 DIAGNOSIS — G43909 Migraine, unspecified, not intractable, without status migrainosus: Secondary | ICD-10-CM | POA: Insufficient documentation

## 2016-01-27 DIAGNOSIS — F319 Bipolar disorder, unspecified: Secondary | ICD-10-CM | POA: Insufficient documentation

## 2016-01-27 DIAGNOSIS — M549 Dorsalgia, unspecified: Secondary | ICD-10-CM

## 2016-01-27 DIAGNOSIS — Z79899 Other long term (current) drug therapy: Secondary | ICD-10-CM | POA: Insufficient documentation

## 2016-01-27 LAB — POC OCCULT BLOOD, ED: Fecal Occult Bld: NEGATIVE

## 2016-01-27 LAB — I-STAT BETA HCG BLOOD, ED (MC, WL, AP ONLY)

## 2016-01-27 MED ORDER — METOCLOPRAMIDE HCL 5 MG/ML IJ SOLN
10.0000 mg | Freq: Once | INTRAMUSCULAR | Status: AC
  Administered 2016-01-28: 10 mg via INTRAVENOUS
  Filled 2016-01-27: qty 2

## 2016-01-27 MED ORDER — SODIUM CHLORIDE 0.9 % IV BOLUS (SEPSIS)
1000.0000 mL | Freq: Once | INTRAVENOUS | Status: AC
  Administered 2016-01-27: 1000 mL via INTRAVENOUS

## 2016-01-27 MED ORDER — DIPHENHYDRAMINE HCL 50 MG/ML IJ SOLN
25.0000 mg | Freq: Once | INTRAMUSCULAR | Status: AC
  Administered 2016-01-28: 25 mg via INTRAVENOUS
  Filled 2016-01-27: qty 1

## 2016-01-27 MED ORDER — KETOROLAC TROMETHAMINE 30 MG/ML IJ SOLN
30.0000 mg | Freq: Once | INTRAMUSCULAR | Status: AC
  Administered 2016-01-28: 30 mg via INTRAVENOUS
  Filled 2016-01-27: qty 1

## 2016-01-27 MED ORDER — MORPHINE SULFATE (PF) 4 MG/ML IV SOLN
4.0000 mg | Freq: Once | INTRAVENOUS | Status: AC
  Administered 2016-01-27: 4 mg via INTRAVENOUS
  Filled 2016-01-27: qty 1
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
## 2016-01-28 MED ORDER — METHOCARBAMOL 500 MG PO TABS: 500.0000 mg | ORAL_TABLET | Freq: Two times a day (BID) | ORAL | Status: DC

## 2016-01-28 NOTE — Discharge Instructions (Signed)

## 2019-02-22 ENCOUNTER — Encounter: Payer: Self-pay | Admitting: Radiology

## 2019-03-06 ENCOUNTER — Encounter: Payer: Self-pay | Admitting: Obstetrics and Gynecology

## 2019-03-12 ENCOUNTER — Encounter: Payer: Self-pay | Admitting: Obstetrics and Gynecology

## 2019-03-13 NOTE — Progress Notes (Signed)
Patient did not keep her GYN new patient appointment for 03/12/2019.  Durene Romans MD Attending Center for [employer])

## 2019-08-24 ENCOUNTER — Other Ambulatory Visit: Payer: Self-pay | Admitting: Nurse Practitioner

## 2019-08-24 DIAGNOSIS — M5489 Other dorsalgia: Secondary | ICD-10-CM

## 2019-09-11 ENCOUNTER — Other Ambulatory Visit: Payer: Self-pay

## 2019-09-11 ENCOUNTER — Ambulatory Visit
Admission: RE | Admit: 2019-09-11 | Discharge: 2019-09-11 | Disposition: A | Discharge status: Home / Self Care | Payer: Medicaid Other | Source: Ambulatory Visit | Attending: Nurse Practitioner | Admitting: Nurse Practitioner

## 2019-09-11 DIAGNOSIS — M5489 Other dorsalgia: Secondary | ICD-10-CM
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
## 2020-05-27 ENCOUNTER — Ambulatory Visit: Payer: Medicaid Other | Admitting: Obstetrics

## 2020-05-27 ENCOUNTER — Other Ambulatory Visit (HOSPITAL_COMMUNITY)
Admission: RE | Admit: 2020-05-27 | Discharge: 2020-05-27 | Disposition: A | Discharge status: Home / Self Care | Payer: Medicaid Other | Source: Ambulatory Visit | Attending: Obstetrics | Admitting: Obstetrics

## 2020-05-27 ENCOUNTER — Encounter: Payer: Self-pay | Admitting: Obstetrics

## 2020-05-27 VITALS — BP 113/72 | HR 64 | Ht 60.0 in | Wt 122.0 lb

## 2020-05-27 DIAGNOSIS — N898 Other specified noninflammatory disorders of vagina: Secondary | ICD-10-CM | POA: Diagnosis not present

## 2020-05-27 DIAGNOSIS — F319 Bipolar disorder, unspecified: Secondary | ICD-10-CM

## 2020-05-27 DIAGNOSIS — Z0000 Encounter for general adult medical examination without abnormal findings: Secondary | ICD-10-CM

## 2020-05-27 DIAGNOSIS — F17200 Nicotine dependence, unspecified, uncomplicated: Secondary | ICD-10-CM | POA: Diagnosis not present

## 2020-05-27 DIAGNOSIS — E569 Vitamin deficiency, unspecified: Secondary | ICD-10-CM

## 2020-05-27 DIAGNOSIS — Z01419 Encounter for gynecological examination (general) (routine) without abnormal findings: Secondary | ICD-10-CM

## 2020-05-27 MED ORDER — VITAFOL ULTRA 29-0.6-0.4-200 MG PO CAPS: 1.0000 | ORAL_CAPSULE | Freq: Every day | ORAL | 4 refills | Status: DC

## 2020-05-27 NOTE — Progress Notes (Signed)
Subjective:        Monica Wiggins is a 29 y.o. female here for a routine exam.  Current complaints: Irregular periods for past 3- 4 . Bled for 7 days one month.  Contraception:  BTL in 2019, and periods were normal until a few months ago.     Personal health questionnaire:  Is patient Ashkenazi Jewish, have a family history of breast and/or ovarian cancer: no Is there a family history of uterine cancer diagnosed at age < 79, gastrointestinal cancer, urinary tract cancer, family member who is a Personnel officer syndrome-associated carrier: no Is the patient overweight and hypertensive, family history of diabetes, personal history of gestational diabetes, preeclampsia or PCOS: no Is patient over 20, have PCOS,  family history of premature CHD under age 52, diabetes, smoke, have hypertension or peripheral artery disease:  no At any time, has a partner hit, kicked or otherwise hurt or frightened you?: no Over the past 2 weeks, have you felt down, depressed or hopeless?: no Over the past 2 weeks, have you felt little interest or pleasure in doing things?:no   Gynecologic History Patient's last menstrual period was 05/22/2020. Contraception: tubal ligation Last [id number] . Results were: unknown Last mammogram: n/a. Results were: n/a  Obstetric History OB History  Gravida Para Term Preterm AB Living  1 1 1     1   SAB TAB Ectopic Multiple Live Births          1    # Outcome Date GA Lbr Len/2nd Weight Sex Delivery Anes PTL Lv  1 Term 03/16/20    F CS-LTranv   LIV    Past Medical History:  Diagnosis Date   Bipolar 1 disorder (HCC)    DJD (degenerative joint disease)    H/O gastroesophageal reflux (GERD)    History of spinal fracture    Migraines    Scoliosis     Past Surgical History:  Procedure Laterality Date   CESAREAN SECTION     TONSILLECTOMY       Current Outpatient Medications:    buprenorphine (SUBUTEX) 8 MG SUBL SL tablet, Place under the tongue daily., Disp: ,  Rfl:    furosemide (LASIX) 20 MG tablet, Take 20 mg by mouth., Disp: , Rfl:    hydrOXYzine (VISTARIL) 25 MG capsule, Take 25 mg by mouth 3 (three) times daily as needed., Disp: , Rfl:    buPROPion (WELLBUTRIN XL) 300 MG 24 hr tablet, Take 300 mg by mouth daily., Disp: , Rfl:    methocarbamol (ROBAXIN) 500 MG tablet, Take 1 tablet (500 mg total) by mouth 2 (two) times daily., Disp: 20 tablet, Rfl: 0   omeprazole (PRILOSEC) 20 MG capsule, Take 20 mg by mouth 2 (two) times daily.  , Disp: , Rfl:    Prenat-Fe Poly-Methfol-FA-DHA (VITAFOL ULTRA) 29-0.6-0.4-200 MG CAPS, Take 1 capsule by mouth daily before breakfast., Disp: 90 capsule, Rfl: 4   topiramate (TOPAMAX) 100 MG tablet, Take 100 mg by mouth 2 (two) times daily. , Disp: , Rfl:    Vilazodone HCl (VIIBRYD) 40 MG TABS, Take 40 mg by mouth daily., Disp: , Rfl:  Allergies  Allergen Reactions   Ciprofloxacin Hcl Other (See Comments)    Almost had a stroke    Social History   Tobacco Use   Smoking status: Smoker, Current Status Unknown    Packs/day: 0.50    Types: Cigarettes   Smokeless tobacco: Never Used  Substance Use Topics   Alcohol use: Yes    Comment:  occ    History reviewed. No pertinent family history.    Review of Systems  Constitutional: negative for fatigue and weight loss Respiratory: negative for cough and wheezing Cardiovascular: negative for chest pain, fatigue and palpitations Gastrointestinal: negative for abdominal pain and change in bowel habits Musculoskeletal:negative for myalgias Neurological: negative for gait problems and tremors Behavioral/Psych: negative for abusive relationship, depression Endocrine: negative for temperature intolerance    Genitourinary:negative for abnormal menstrual periods, genital lesions, hot flashes, sexual problems and vaginal discharge Integument/breast: negative for breast lump, breast tenderness, nipple discharge and skin lesion(s)    Objective:       BP  113/72    Pulse 64    Ht 5' (1.524 m)    Wt 122 lb (55.3 kg)    LMP 05/22/2020    BMI 23.83 kg/m  General:   alert  Skin:   no rash or abnormalities  Lungs:   clear to auscultation bilaterally  Heart:   regular rate and rhythm, S1, S2 normal, no murmur, click, rub or gallop  Breasts:   normal without suspicious masses, skin or nipple changes or axillary nodes  Abdomen:  normal findings: no organomegaly, soft, non-tender and no hernia  Pelvis:  External genitalia: normal general appearance Urinary system: urethral meatus normal and bladder without fullness, nontender Vaginal: normal without tenderness, induration or masses Cervix: normal appearance Adnexa: normal bimanual exam Uterus: anteverted and non-tender, normal size   Lab Review Urine pregnancy test Labs reviewed yes Radiologic studies reviewed no  50% of 25 min visit spent on counseling and coordination of care.   Assessment:     1. Encounter for gynecological examination with Papanicolaou smear of cervix Rx: - Cytology - PAP( [facility]) - US PELVIC COMPLETE WITH TRANSVAGINAL; Future  2. Vaginal discharge Rx: - Cervicovaginal ancillary only( )  3. Vitamin deficiency Rx: - Prenat-Fe Poly-Methfol-FA-DHA (VITAFOL ULTRA) 29-0.6-0.4-200 MG CAPS; Take 1 capsule by mouth daily before breakfast.  Dispense: 90 capsule; Refill: 4  4. Tobacco dependence - cessation recommended with the aid of medication and behavioral modification  5. Bipolar 1 disorder (HCC) - clinically stable    Plan:    Education reviewed: calcium supplements, depression evaluation, low fat, low cholesterol diet, safe sex/STD prevention, self breast exams, skin cancer screening, smoking cessation and weight bearing exercise. Contraception: tubal ligation. Follow up in: 1 year.   Meds ordered this encounter  Medications   Prenat-Fe Poly-Methfol-FA-DHA (VITAFOL ULTRA) 29-0.6-0.4-200 MG CAPS    Sig: Take 1 capsule by mouth daily before  breakfast.    Dispense:  90 capsule    Refill:  4   Orders Placed This Encounter  Procedures   US PELVIC COMPLETE WITH TRANSVAGINAL    Standing Status:   Future    Standing Expiration Date:   05/27/2021    Order Specific Question:   Reason for Exam (SYMPTOM  OR DIAGNOSIS REQUIRED)    Answer:   AUB    Order Specific Question:   Preferred imaging location?    Answer:   [facility]    Brock Bad, MD 05/27/2020 12:22 PM

## 2020-05-27 NOTE — Progress Notes (Signed)
Pt states she had a tubal in 2019.  States she has had 7 episodes of bleeding in one month.

## 2020-05-28 ENCOUNTER — Other Ambulatory Visit: Payer: Self-pay | Admitting: Obstetrics

## 2020-05-28 DIAGNOSIS — B9689 Other specified bacterial agents as the cause of diseases classified elsewhere: Secondary | ICD-10-CM

## 2020-05-28 LAB — CERVICOVAGINAL ANCILLARY ONLY
Bacterial Vaginitis (gardnerella): POSITIVE — AB
Candida Glabrata: NEGATIVE
Candida Vaginitis: NEGATIVE
Chlamydia: NEGATIVE
Comment: NEGATIVE
Comment: NEGATIVE
Comment: NEGATIVE
Comment: NEGATIVE
Comment: NEGATIVE
Comment: NORMAL
Neisseria Gonorrhea: NEGATIVE
Trichomonas: NEGATIVE

## 2020-05-28 LAB — CYTOLOGY - PAP
Comment: NEGATIVE
Diagnosis: NEGATIVE
High risk HPV: NEGATIVE

## 2020-05-28 MED ORDER — METRONIDAZOLE 500 MG PO TABS: 500.0000 mg | ORAL_TABLET | Freq: Two times a day (BID) | ORAL | 2 refills | Status: DC

## 2020-06-03 ENCOUNTER — Ambulatory Visit: Admission: RE | Admit: 2020-06-03 | Payer: Medicaid Other | Source: Ambulatory Visit

## 2020-06-24 ENCOUNTER — Ambulatory Visit
Admission: RE | Admit: 2020-06-24 | Discharge: 2020-06-24 | Disposition: A | Discharge status: Home / Self Care | Payer: Medicaid Other | Source: Ambulatory Visit | Attending: Obstetrics | Admitting: Obstetrics

## 2020-06-24 ENCOUNTER — Other Ambulatory Visit: Payer: Self-pay

## 2020-06-24 DIAGNOSIS — Z01419 Encounter for gynecological examination (general) (routine) without abnormal findings: Secondary | ICD-10-CM | POA: Insufficient documentation

## 2022-07-16 ENCOUNTER — Encounter: Payer: Self-pay | Admitting: Physician Assistant

## 2022-07-16 ENCOUNTER — Ambulatory Visit: Payer: No Typology Code available for payment source | Admitting: Physician Assistant

## 2022-07-16 VITALS — BP 108/80 | HR 95 | Temp 97.9°F | Ht 61.0 in | Wt 142.8 lb

## 2022-07-16 DIAGNOSIS — F319 Bipolar disorder, unspecified: Secondary | ICD-10-CM

## 2022-07-16 DIAGNOSIS — M5440 Lumbago with sciatica, unspecified side: Secondary | ICD-10-CM

## 2022-07-16 DIAGNOSIS — M4120 Other idiopathic scoliosis, site unspecified: Secondary | ICD-10-CM | POA: Diagnosis not present

## 2022-07-16 DIAGNOSIS — R600 Localized edema: Secondary | ICD-10-CM

## 2022-07-16 DIAGNOSIS — G8929 Other chronic pain: Secondary | ICD-10-CM

## 2022-07-16 DIAGNOSIS — F902 Attention-deficit hyperactivity disorder, combined type: Secondary | ICD-10-CM

## 2022-07-16 NOTE — Progress Notes (Unsigned)
Subjective:    Patient ID: Monica Wiggins, female    DOB: 08-08-1991, 31 y.o.   MRN: [id number]  Chief Complaint  Patient presents with   New Patient (Initial Visit)    Pt into establish care with PCP, take over medication management; no true concerns to discuss; pt had change in insurance and had to find new PCP;     HPI 31 y.o. patient presents today for new patient establishment with me.  Patient was previously established with [facility], [facility]. States she had a sudden change in insurance and had to find new PCP.   Current Care Team: No current specialists   Current medications:  Lasix 20 mg 1-2 times daily, for lower extremity swelling, has been ongoing, no known cause  Tizanidine HCL 2 mg BID to TID - for back issues  Vraylar 1.5 mg once daily - Bipolar 1 Fluoxetine HCL 20 mg once daily - Bipolar 1 Adderall XR 25 mg once daily - ADD   Subutex 8- 2 mg - takes two to dissolve under tongue at one time, back issues   Past Medical History:  Diagnosis Date   ADD (attention deficit disorder) 2013   Bipolar 1 disorder (HCC)    dx at age 31   DJD (degenerative joint disease)    Fluid retention in legs    H/O gastroesophageal reflux (GERD)    History of spinal fracture    Migraines    Scoliosis     Past Surgical History:  Procedure Laterality Date   CESAREAN SECTION     TONSILLECTOMY     TUBAL LIGATION     VERTEBROPLASTY  12/2015   L3    Family History  Problem Relation Age of Onset   Skin cancer Mother    Anxiety disorder Mother    Depression Mother    Migraines Mother    Anxiety disorder Father    Depression Father    High Cholesterol Sister    Hypertension Sister    Diabetes Sister    Anxiety disorder Sister    Depression Sister    Migraines Sister    Hyperlipidemia Maternal Grandmother    Hypertension Maternal Grandmother    Diabetes Maternal Grandmother    Breast cancer Maternal Grandmother    Migraines Maternal Grandmother     Hyperlipidemia Maternal Grandfather    Hypertension Maternal Grandfather    Diabetes Maternal Grandfather     Social History   Tobacco Use   Smoking status: Former    Packs/day: 0.50    Types: Cigarettes    Quit date: 2018    Years since quitting: 5.7   Smokeless tobacco: Never  Substance Use Topics   Alcohol use: Yes    Comment: occasionally   Drug use: No     Allergies  Allergen Reactions   Ciprofloxacin Hcl Other (See Comments)    Almost had a stroke    Review of Systems NEGATIVE UNLESS OTHERWISE INDICATED IN HPI      Objective:     BP 108/80 (BP Location: Left Arm)   Pulse 95   Temp 97.9 F (36.6 C) (Temporal)   Ht 5\' 1"  (1.549 m)   Wt 142 lb 12.8 oz (64.8 kg)   SpO2 99%   BMI 26.98 kg/m   Wt Readings from Last 3 Encounters:  07/16/22 142 lb 12.8 oz (64.8 kg)  05/27/20 122 lb (55.3 kg)  08/17/15 103 lb (46.7 kg)    BP Readings from Last 3 Encounters:  07/16/22 108/80  05/27/20 113/72  01/28/16 104/77     Physical Exam Vitals and nursing note reviewed.  Constitutional:      Appearance: Normal appearance. She is normal weight. She is not toxic-appearing.  HENT:     Head: Normocephalic and atraumatic.     Right Ear: Tympanic membrane, ear canal and external ear normal.     Left Ear: Tympanic membrane, ear canal and external ear normal.     Nose: Nose normal.     Mouth/Throat:     Mouth: Mucous membranes are moist.  Eyes:     Extraocular Movements: Extraocular movements intact.     Conjunctiva/sclera: Conjunctivae normal.     Pupils: Pupils are equal, round, and reactive to light.  Cardiovascular:     Rate and Rhythm: Normal rate and regular rhythm.     Pulses: Normal pulses.     Heart sounds: Normal heart sounds. No murmur heard. Pulmonary:     Effort: Pulmonary effort is normal.     Breath sounds: Normal breath sounds.  Abdominal:     General: Abdomen is flat. Bowel sounds are normal.     Palpations: Abdomen is soft.     Tenderness:  There is no right CVA tenderness or left CVA tenderness.  Musculoskeletal:        General: Normal range of motion.     Cervical back: Normal range of motion and neck supple.     Right lower leg: No edema.     Left lower leg: No edema.  Skin:    General: Skin is warm and dry.     Findings: No rash.  Neurological:     General: No focal deficit present.     Mental Status: She is alert and oriented to person, place, and time.     Motor: No weakness.     Gait: Gait normal.  Psychiatric:        Mood and Affect: Mood normal.        Behavior: Behavior normal.        Assessment & Plan:  Chronic left-sided low back pain with sciatica, sciatica laterality unspecified Assessment & Plan: Subutex 8- 2 mg - takes two to dissolve under tongue at one time, back issues  Tizanidine HCL 2 mg BID to TID - for back issues  Patient was previously seen and had these medications filled at [facility].  She understands that I will not be able to fill these medications and she will need to see a pain management center.  A referral was placed for her today.  Orders: -     Ambulatory referral to Pain Clinic  Other idiopathic scoliosis, unspecified spinal region -     Ambulatory referral to Pain Clinic  Fluid retention in legs Assessment & Plan: Stable, chronic for years, uncertain etiology.  She takes Lasix 20 mg and as well.   Bipolar 1 disorder (HCC) Assessment & Plan: Vraylar 1.5 mg once daily - Bipolar 1 Fluoxetine HCL 20 mg once daily - Bipolar 1  Stable on current regimen.  Patient will need to see psychiatry.  Referral was placed for her today.  Orders: -     Ambulatory referral to Psychiatry  Attention deficit hyperactivity disorder (ADHD), combined type Assessment & Plan: Adderall XR 25 mg once daily  Patient was asking for refill today.  I reviewed her recent PDMP, and she is not due for refill yet.  I would prefer that she sees psychiatry to manage this as  well as  her bipolar medications.  Referral placed for her.       Return in about 3 months (around 10/16/2022) for well woman with labs .    Kyasia Steuck M Mukesh Kornegay, PA-C

## 2022-07-20 DIAGNOSIS — F902 Attention-deficit hyperactivity disorder, combined type: Secondary | ICD-10-CM | POA: Insufficient documentation

## 2022-07-20 NOTE — Assessment & Plan Note (Signed)
Adderall XR 25 mg once daily  Patient was asking for refill today.  I reviewed her recent PDMP, and she is not due for refill yet.  I would prefer that she sees psychiatry to manage this as well as her bipolar medications.  Referral placed for her.

## 2022-07-20 NOTE — Assessment & Plan Note (Signed)
Stable, chronic for years, uncertain etiology.  She takes Lasix 20 mg and as well.

## 2022-07-20 NOTE — Assessment & Plan Note (Signed)
Vraylar 1.5 mg once daily - Bipolar 1 Fluoxetine HCL 20 mg once daily - Bipolar 1  Stable on current regimen.  Patient will need to see psychiatry.  Referral was placed for her today.

## 2022-07-20 NOTE — Assessment & Plan Note (Signed)
Subutex 8- 2 mg - takes two to dissolve under tongue at one time, back issues  Tizanidine HCL 2 mg BID to TID - for back issues  Patient was previously seen and had these medications filled at [facility].  She understands that I will not be able to fill these medications and she will need to see a pain management center.  A referral was placed for her today.

## 2022-07-26 ENCOUNTER — Ambulatory Visit (HOSPITAL_COMMUNITY): Payer: No Typology Code available for payment source | Admitting: Psychiatry

## 2022-08-03 ENCOUNTER — Ambulatory Visit (HOSPITAL_BASED_OUTPATIENT_CLINIC_OR_DEPARTMENT_OTHER): Payer: No Typology Code available for payment source | Admitting: Psychiatry

## 2022-08-03 ENCOUNTER — Encounter (HOSPITAL_COMMUNITY): Payer: Self-pay | Admitting: Psychiatry

## 2022-08-03 VITALS — Wt 140.0 lb

## 2022-08-03 DIAGNOSIS — F319 Bipolar disorder, unspecified: Secondary | ICD-10-CM | POA: Diagnosis not present

## 2022-08-03 DIAGNOSIS — F902 Attention-deficit hyperactivity disorder, combined type: Secondary | ICD-10-CM

## 2022-08-03 DIAGNOSIS — F419 Anxiety disorder, unspecified: Secondary | ICD-10-CM | POA: Diagnosis not present

## 2022-08-03 MED ORDER — FLUOXETINE HCL 20 MG PO CAPS: 20.0000 mg | ORAL_CAPSULE | Freq: Every day | ORAL | 1 refills | Status: AC

## 2022-08-03 MED ORDER — ATOMOXETINE HCL 25 MG PO CAPS: 25.0000 mg | ORAL_CAPSULE | Freq: Two times a day (BID) | ORAL | 1 refills | Status: AC

## 2022-08-03 MED ORDER — CARIPRAZINE HCL 1.5 MG PO CAPS: 1.5000 mg | ORAL_CAPSULE | Freq: Every day | ORAL | 0 refills | Status: DC

## 2022-08-03 MED ORDER — CARIPRAZINE HCL 1.5 MG PO CAPS: 1.5000 mg | ORAL_CAPSULE | Freq: Every day | ORAL | 1 refills | Status: AC

## 2022-08-03 NOTE — Progress Notes (Signed)
[place] Initial Assessment Note  Patient Location:Home Provider Location:Home Office   I connected with [facility] by video and verified that I am talking with correct person using two identifiers.   I discussed the limitations, risks, security and privacy concerns of performing an evaluation and management service virtually and the availability of in person appointments. I also discussed with the patient that there may be a patient responsible charge related to this service. The patient expressed understanding and agreed to proceed.  Naz Larrow [id number] 31 y.o.  08/03/2022 9:08 AM  Bipolar 1 disorder ([place]) - Plan: FLUoxetine (PROZAC) 20 MG capsule, cariprazine (VRAYLAR) 1.5 MG capsule, DISCONTINUED: cariprazine (VRAYLAR) 1.5 MG capsule  Anxiety - Plan: FLUoxetine (PROZAC) 20 MG capsule  Attention deficit hyperactivity disorder (ADHD), combined type - Plan: atomoxetine (STRATTERA) 25 MG capsule   Chief Complaint:  I was referred from my primary care doctor at [facility].  My insurance changed.  History of Present Illness:  Patient is 31 year old Caucasian, unemployed, single female who is referred from PCP after her insurance changed and no longer continue treatment at her PCP office.  Patient has a diagnosis of bipolar disorder, anxiety and given the diagnosis of ADD.  Patient reported taking Vraylar, Prozac and Adderall from her PCP. Patient has inpatient treatment at [facility] in 2021.  She did not recall very well but reported at times she has a lot of crying spells, severe mood swing, anger, paranoia and not doing well.  She states 7 days in the hospital and upon discharge she was recommended to see Dr. Stanton Kidney, a psychiatrist at [facility] and later prescriptions were given by PCP.  Before inpatient she was taking Wellbutrin which was discontinued in the hospital.  Patient reported things are going well and she feels her  mania, anxiety and depression is a stable.  She never had a formal psychological testing but she had taken ADD medication from the PCP.  In the past she had tried Zoloft, Abilify, Seroquel, olanzapine, Risperdal.  She recall Risperdal work very well but olanzapine, Seroquel and Abilify caused weight gain.  Patient lives with her mother since she discharged from the hospital in 2021.  Patient has a 68-year-old and 33 year old but gets father has the custody once patient was admitted in 2021.  [place] Reeks only allow 8 hours a week visitation and patient is waiting for her next court date.  Patient has good relationship with the kids father.  Her long-term plan is to get married once restriction from the court ends.  Patient currently not working and she is on disability after having an accident in 2016 and hurt her back.  She cannot lift and do things because of the back pain.  Patient reported medicine had helped her mood, anxiety and she is able to focus and multitasking.  She like rule out because it does not cause weight gain but also anxious if her insurance will approve or not.  She admitted most of her manic symptoms are stable but is still there are times when she does reported increased energy with cleaning house.  She does walk and listen to music to relax herself.  She has good social network.  She sleeps 7 to 8 hours but occasionally she has dreams.  She denies any current hallucination, paranoia, suicidal thoughts or homicidal thoughts.  Her last visit with her PCP Dr. Coralyn Mark was a month ago.  She was getting samples of Vraylar which about to finish soon.  She takes Prozac 20 mg that helps her anxiety.  She denies any weight gain.  She takes pain medicine but since insurance changed she is looking for a pain management as not able to see her PCP.  Patient has history of headaches but it has been much improved.  She used to take Topamax.  Patient denies drug use.  She is not seeing any therapist but open to see if  needed.  Her appetite is okay.  She denies any anhedonia or any feeling of hopelessness or worthlessness.  She denies any grandiosity, flight of ideas, excessive spending or road rage.  She denies any current legal issues.  Patient denies any nightmares or flashback.   Past Psychiatric History: History of depression and anxiety and started taking medication at age 76.  She was given ADD medication from PCP.  She had a history of inpatient in 2021 at [facility] after an episode of extreme paranoia and mood symptoms.  She denies any history of suicidal attempt.  She had tried Zoloft, Abilify, Seroquel, Wellbutrin, olanzapine, Risperdal.  She like Risperdal but other medicine cause weight gain.  She only saw once or twice to psychiatrist and mostly medicines were managed by her PCP.  Her last provider was Dr. Coralyn Mark at [facility].  Patient denies any history of illegal substance use, DUI, legal issues, physical or sexual or verbal abuse.   Past Medical History:  Diagnosis Date   ADD (attention deficit disorder) 2013   Bipolar 1 disorder ([employer])    dx at age 59   DJD (degenerative joint disease)    Fluid retention in legs    H/O gastroesophageal reflux (GERD)    History of spinal fracture    Migraines    Scoliosis      Traumatic Head Injury: Denies history of head trauma.  Work History; Patient had worked in [employer] however in 2016 after the accident that her her back she is on disability.  Psychosocial History; Patient born and raised in [place].  Father died in a motor vehicle accident when she was only 31 years old.  Patient was raised by her mother.  She has 59-year-old and 98 year old who lives with their father.  Patient has only 8-hour a week custody after she requires inpatient in 2021.  Patient lives with her mother since she discharged from the hospital.  She reported her relationship with the Father is going well.  Neurologic: Headache:  No Seizure: No Paresthesias: No   Outpatient Encounter Medications as of 08/03/2022  Medication Sig   amphetamine-dextroamphetamine (ADDERALL XR) 25 MG 24 hr capsule Take by mouth.   buprenorphine (SUBUTEX) 8 MG SUBL SL tablet Place under the tongue daily.   FLUoxetine (PROZAC) 20 MG capsule Take 20 mg by mouth daily.   furosemide (LASIX) 20 MG tablet Take 20 mg by mouth.   omeprazole (PRILOSEC) 20 MG capsule Take 20 mg by mouth 2 (two) times daily.     tiZANidine (ZANAFLEX) 2 MG tablet Take 2 mg by mouth 3 (three) times daily.   No facility-administered encounter medications on file as of 08/03/2022.    No results found for this or any previous visit (from the past 2160 hour(s)).    Constitutional:  Wt 140 lb (63.5 kg)   BMI 26.45 kg/m    Musculoskeletal: Strength & Muscle Tone: within normal limits Gait & Station: normal Patient leans: N/A  Psychiatric Specialty Exam: Physical Exam  Review of Systems  Musculoskeletal:  Positive  for back pain.  Psychiatric/Behavioral:  The patient is nervous/anxious.     Weight 140 lb (63.5 kg).There is no height or weight on file to calculate BMI.  General Appearance: Casual  Eye Contact:  Fair  Speech:  Normal Rate  Volume:  Normal  Mood:  Anxious  Affect:  Appropriate  Thought Process:  Goal Directed  Orientation:  Full (Time, Place, and Person)  Thought Content:  Rumination  Suicidal Thoughts:  No  Homicidal Thoughts:  No  Memory:  Immediate;   Good Recent;   Good Remote;   Good  Judgement:  Intact  Insight:  Present  Psychomotor Activity:  Normal  Concentration:  Concentration: Good and Attention Span: Good  Recall:  Good  Fund of Knowledge:  Good  Language:  Good  Akathisia:  No  Handed:  Right  AIMS (if indicated):     Assets:  Communication Skills Desire for Improvement Housing Resilience Social Support Transportation  ADL's:  Intact  Cognition:  WNL  Sleep:        Assessment/Plan:  Patient is a  31 year old Caucasian, unemployed single female with history of bipolar disorder, anxiety and given the diagnosis of ADD but no formal psychological testing.  I review current medication, history, blood work results and psychosocial history.  Currently she is taking Vraylar 1.5 mg daily, Lasix 20 mg daily and amphetamine salts 25 mg daily.  I discussed that stimulant can cause increased anxiety and mania and without psychological testing it would be difficult to prescribe stimulants.  Patient understands and like to get referral for psychological testing.  In the meantime I recommend she can try a nonstimulant Strattera 25 mg 2 times a day.  Patient also concerned about Vraylar as her insurance may not cover and she is getting samples from her PCP.  I recommend we will provide samples of Vraylar 1.5 mg daily and also try to get prescription filled.  However if medication do not improve either we can try getting a prior authorization or try low-dose Risperdal which had worked in the past.  We will continue Prozac 20 mg daily which is working for her.  I also offer consider therapy to help her anxiety and patient agreed to look into it.  We will get records from her PCP.  I discussed safety concerns and anytime having active suicidal thoughts or homicidal thought that she need to call 911 or go to local emergency room.  Follow-up in 4 weeks.  Kathlee Nations, MD 08/03/2022    Follow Up Instructions: I discussed the assessment and treatment plan with the patient. The patient was provided an opportunity to ask questions and all were answered. The patient agreed with the plan and demonstrated an understanding of the instructions.   The patient was advised to call back or seek an in-person evaluation if the symptoms worsen or if the condition fails to improve as anticipated.   Collaboration of Care: Primary Care Provider AEB notes are available in epic to review.   Patient/Guardian was advised Release of  Information must be obtained prior to any record release in order to collaborate their care with an outside provider. Patient/Guardian was advised if they have not already done so to contact the registration department to sign all necessary forms in order for [place] to release information regarding their care.    Consent: Patient/Guardian gives verbal consent for treatment and assignment of benefits for services provided during this visit. Patient/Guardian expressed understanding and agreed to proceed.  I provided 75 minutes of non-face-to-face time during this encounter.

## 2022-08-23 ENCOUNTER — Telehealth (HOSPITAL_COMMUNITY): Payer: Self-pay | Admitting: *Deleted

## 2022-08-23 NOTE — Telephone Encounter (Signed)
Pt called requesting more samples of Vrylar 1.5 mg. Writer returned pt call, LVM, advising that we do have samples available and gave office hours.

## 2022-09-03 ENCOUNTER — Telehealth (HOSPITAL_COMMUNITY): Payer: No Typology Code available for payment source | Admitting: Psychiatry

## 2022-10-15 ENCOUNTER — Ambulatory Visit: Payer: No Typology Code available for payment source | Admitting: Physician Assistant

## 2024-08-14 ENCOUNTER — Other Ambulatory Visit: Payer: Self-pay | Admitting: Obstetrics and Gynecology

## 2024-08-14 DIAGNOSIS — Z1231 Encounter for screening mammogram for malignant neoplasm of breast: Secondary | ICD-10-CM

## 2024-08-14 DIAGNOSIS — N644 Mastodynia: Secondary | ICD-10-CM

## 2024-08-14 DIAGNOSIS — N6452 Nipple discharge: Secondary | ICD-10-CM

## 2024-09-07 ENCOUNTER — Inpatient Hospital Stay
Admission: RE | Admit: 2024-09-07 | Discharge: 2024-09-07 | Attending: Obstetrics and Gynecology | Admitting: Obstetrics and Gynecology

## 2024-09-07 ENCOUNTER — Inpatient Hospital Stay
Admission: RE | Admit: 2024-09-07 | Discharge: 2024-09-07 | Payer: MEDICAID | Attending: Obstetrics and Gynecology | Admitting: Obstetrics and Gynecology

## 2024-09-07 ENCOUNTER — Encounter

## 2024-09-07 DIAGNOSIS — N6452 Nipple discharge: Secondary | ICD-10-CM

## 2024-09-07 DIAGNOSIS — N644 Mastodynia: Secondary | ICD-10-CM
